# Patient Record
Sex: Male | Born: 1960 | Hispanic: Yes | Marital: Married | Smoking: Never smoker
Health system: Southern US, Community
[De-identification: ages and names within clinical notes are randomized; demographics above are authoritative.]

## PROBLEM LIST (undated history)

## (undated) DIAGNOSIS — IMO0002 Reserved for concepts with insufficient information to code with codable children: Secondary | ICD-10-CM

## (undated) HISTORY — PX: CHOLECYSTECTOMY: SHX55

## (undated) HISTORY — DX: Reserved for concepts with insufficient information to code with codable children: IMO0002

---

## 2013-05-30 ENCOUNTER — Other Ambulatory Visit: Payer: Self-pay

## 2013-05-30 MED ORDER — GABAPENTIN 300 MG PO CAPS: 300.0000 mg | ORAL_CAPSULE | Freq: Two times a day (BID) | ORAL | Status: AC

## 2013-06-07 ENCOUNTER — Ambulatory Visit: Payer: Managed Care, Other (non HMO)

## 2013-06-07 ENCOUNTER — Ambulatory Visit (INDEPENDENT_AMBULATORY_CARE_PROVIDER_SITE_OTHER): Payer: Managed Care, Other (non HMO) | Admitting: Family Medicine

## 2013-06-07 VITALS — BP 150/82 | HR 63 | Temp 98.1°F | Resp 16 | Ht 73.0 in | Wt 219.0 lb

## 2013-06-07 DIAGNOSIS — M545 Low back pain, unspecified: Secondary | ICD-10-CM

## 2013-06-07 DIAGNOSIS — R109 Unspecified abdominal pain: Secondary | ICD-10-CM

## 2013-06-07 LAB — COMPREHENSIVE METABOLIC PANEL
ALT: 18 U/L (ref 0–53)
AST: 18 U/L (ref 0–37)
Albumin: 4.5 g/dL (ref 3.5–5.2)
Alkaline Phosphatase: 64 U/L (ref 39–117)
Potassium: 3.9 mEq/L (ref 3.5–5.3)
Sodium: 140 mEq/L (ref 135–145)
Total Bilirubin: 0.6 mg/dL (ref 0.3–1.2)
Total Protein: 7.5 g/dL (ref 6.0–8.3)

## 2013-06-07 LAB — POCT URINALYSIS DIPSTICK
Bilirubin, UA: NEGATIVE
Glucose, UA: NEGATIVE
Leukocytes, UA: NEGATIVE
Protein, UA: NEGATIVE
Spec Grav, UA: >=1.03

## 2013-06-07 LAB — POCT CBC
Granulocyte percent: 64.6 %G (ref 37–80)
HCT, POC: 43.9 % (ref 43.5–53.7)
MCV: 97.3 fL — AB (ref 80–97)
MID (cbc): 0.6 (ref 0–0.9)
POC Granulocyte: 4.8 (ref 2–6.9)
Platelet Count, POC: 251 10*3/uL (ref 142–424)
RBC: 4.51 M/uL — AB (ref 4.69–6.13)
RDW, POC: 13.1 %

## 2013-06-07 LAB — LIPASE: Lipase: 52 U/L (ref 0–75)

## 2013-06-07 LAB — PSA: PSA: 0.76 ng/mL (ref <=4.00)

## 2013-06-07 LAB — POCT UA - MICROSCOPIC ONLY
Bacteria, U Microscopic: NEGATIVE
Crystals, Ur, HPF, POC: NEGATIVE

## 2013-06-07 MED ORDER — MELOXICAM 7.5 MG PO TABS: 7.5000 mg | ORAL_TABLET | Freq: Every day | ORAL | Status: AC

## 2013-06-07 MED ORDER — OMEPRAZOLE 20 MG PO CPDR: 20.0000 mg | DELAYED_RELEASE_CAPSULE | Freq: Every day | ORAL | Status: AC

## 2013-06-07 NOTE — Patient Instructions (Addendum)
Start the meloxicam once per day for low back pain - also take omeprazole once per day to help protect your stomach while taking this medicine.  If any dark/tarry stools, or blood in the stool, or any increase in abdominal pain - return to clinic or emergency room immediately. Start the other exercises as in the back care manual.  Recheck in 2 weeks - Return to the clinic or go to the nearest emergency room if any of your symptoms worsen or new symptoms occur.  Can call [phone] to schedule a physical.

## 2013-06-07 NOTE — Progress Notes (Signed)
Subjective:    Patient ID: Glenn Walker, male    DOB: 1961/12/09, 52 y.o.   MRN: [id number]  HPI Glenn Walker is a 52 y.o. male  New patient to me. Prior living in [place], moved a year ago. last seen by PCP in [place] - infrequent visits.  Last physical a little over a year ago.  Will schedule physical so we can obtain prior records.   Abd pain- lower - both sides, pain for 3-4 years.  Has had evaluated IN [place]. Had colonoscopy last year in [place] - few polyps - colonoscopy every 2-3 years d/t polyps. No FH colon cancer.  Endoscopy - 2 years ago - minor reflux and gastritis- prior on meds, but stopped after 6 months. Diagnosed with duodenal ulcer in 1993. Off meds now for past year. Same pain for years. No recent change in pain.  Hx of cholecystectomy in 1998. No blood in stool, no dark tarry stools.  Loose stools since cholecystectomy. BM about 2x/day.  No recent bowel changes. No blood in urine, no dysuria, nocturia  - 1-2 times per night. No new urinary symptoms. Prostate test ok beginning of last year. No fh of intestinal CA, bladder or prostate CA. No N/V.   LBP - past 5 years. seen by [facility] about 6 months ago - [name]. Unknown diagnosis.  No surgery, NKI, no MVA or specific time of onset. Notices of sitting for long time. Numb on outside of R upper thigh at times. Rx: Neurontin 300mg  QD - no relief. Also instructed to take B12, no relief.  No bowel or bladder incontinence, no saddle anesthesia, no lower extremity weakness. Other urgent care eval 8 months ago - slight relief with muscle relaxer. Rare - about once per month ibuprofen.   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   Review of Systems  Constitutional: Negative for fever, chills and unexpected weight change.  Gastrointestinal: Negative for nausea, vomiting, abdominal pain, blood in stool, abdominal distention and anal bleeding.  Genitourinary: Negative for dysuria and difficulty urinating.    Musculoskeletal: Positive for myalgias and back pain. Negative for joint swelling and gait problem.  Skin: Negative for color change and rash.  Neurological: Negative for weakness.       No le weakness. No bowel/bladder incontinence, no saddle anesthesia.  other as in HPI.      Objective:   Physical Exam  Vitals reviewed. Constitutional: He appears well-developed and well-nourished.  HENT:  Head: Normocephalic and atraumatic.  Neck: Normal range of motion.  Pulmonary/Chest: Effort normal.  Abdominal: Soft. Normal appearance and bowel sounds are normal. He exhibits no distension. There is no tenderness (nontender on exam - states feels soreness with twisting side to side. ). There is no rebound, no guarding and no CVA tenderness. No hernia. Hernia confirmed negative in the ventral area, confirmed negative in the right inguinal area and confirmed negative in the left inguinal area.  Musculoskeletal: He exhibits tenderness.       Lumbar back: He exhibits tenderness and bony tenderness. He exhibits normal range of motion (slight decreased flexion. ), no edema and no spasm.       Back:  Neurological: He is alert. He has normal strength. He displays normal reflexes. No sensory deficit. He displays no Babinski's sign on the right side.  Reflex Scores:      Patellar reflexes are 2+ on the right side and 2+ on the left side.      Achilles reflexes  are 2+ on the right side and 2+ on the left side. Able to heel and toe walk without difficulty. Negative SLR bliaterally  Skin: Skin is warm and dry.  Psychiatric: He has a normal mood and affect. His behavior is normal.  [facility] reading (PRIMARY) by  Dr. Neva Walker: LS spine - ? Spondylosis and decreased spaceT12 - L1.   Results for orders placed in visit on 06/07/13  POCT CBC      Result Value Range   WBC 7.4  4.6 - 10.2 K/uL   Lymph, poc 2.0  0.6 - 3.4   POC LYMPH PERCENT 27.1  10 - 50 %L   MID (cbc) 0.6  0 - 0.9   POC MID % 8.3  0 - 12 %M   POC  Granulocyte 4.8  2 - 6.9   Granulocyte percent 64.6  37 - 80 %G   RBC 4.51 (*) 4.69 - 6.13 M/uL   Hemoglobin 13.5 (*) 14.1 - 18.1 g/dL   HCT, POC 16.1  09.6 - 53.7 %   MCV 97.3 (*) 80 - 97 fL   MCH, POC 29.9  27 - 31.2 pg   MCHC 30.8 (*) 31.8 - 35.4 g/dL   RDW, POC 04.5     Platelet Count, POC 251  142 - 424 K/uL   MPV 8.7  0 - 99.8 fL  POCT UA - MICROSCOPIC ONLY      Result Value Range   WBC, Ur, HPF, POC 0-1     RBC, urine, microscopic 0-2     Bacteria, U Microscopic neg     Mucus, UA neg     Epithelial cells, urine per micros neg     Crystals, Ur, HPF, POC neg     Casts, Ur, LPF, POC neg     Yeast, UA neg    POCT URINALYSIS DIPSTICK      Result Value Range   Color, UA yellow     Clarity, UA clear     Glucose, UA neg     Bilirubin, UA neg     Ketones, UA trace     Spec Grav, UA >=1.030     Blood, UA neg     pH, UA 5.5     Protein, UA neg     Urobilinogen, UA 0.2     Nitrite, UA neg     Leukocytes, UA Negative         Assessment & Plan:  [name] is a 52 y.o. male  Abdominal  pain, other specified site - longstanding, stable by hx. Plan: POCT CBC, POCT UA - Microscopic Only, POCT urinalysis dipstick, PSA, HELICOBACTER PYLORI  ANTIBODY, IGM, Lipase, Comprehensive metabolic panel, omeprazole (PRILOSEC) 20 MG capsule.  Possible radiation of LBP. By report - remote hx of possible duodenal ulcer and colon polyps by colonoscopy by EKG last year, but prior endoscopy ok.  Trial of Mobic for back as below, but will also rx omeprazole for GI protection.  RTC/ER precautions given.  Back pain, lumbosacral - Plan: POCT CBC, POCT UA - Microscopic Only, POCT urinalysis dipstick, PSA, Comprehensive metabolic panel, DG Lumbar Spine Complete, meloxicam (MOBIC) 7.5 MG tablet.  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 Advised loose fitting beltline.  Trial  of mobic 7.5mg  QD, back care manual and recheck in 2 weeks. Ok to continue neurontin for now, but may need higher dose  if tolerated.   Borderline HGB, borderline BP - recheck at future ov. Rtc/er precautions above.   Plan on scheduling CPE.     Meds ordered this encounter  Medications  . meloxicam (MOBIC) 7.5 MG tablet    Sig: Take 1 tablet (7.5 mg total) by mouth daily.    Dispense:  30 tablet    Refill:  0  . omeprazole (PRILOSEC) 20 MG capsule    Sig: Take 1 capsule (20 mg total) by mouth daily.    Dispense:  30 capsule    Refill:  3     Patient Instructions  Start the meloxicam once per day for low back pain - also take omeprazole once per day to help protect your stomach while taking this medicine.  If any dark/tarry stools, or blood in the stool, or any increase in abdominal pain - return to clinic or emergency room immediately. Start the other exercises as in the back care manual.  Recheck in 2 weeks - Return to the clinic or go to the nearest emergency room if any of your symptoms worsen or new symptoms occur.  Can call [phone] to schedule a physical.

## 2013-06-09 LAB — HELICOBACTER PYLORI  ANTIBODY, IGM: Helicobacter pylori, IgM: 3.1 U/mL (ref <9.0)

## 2013-06-21 ENCOUNTER — Encounter (INDEPENDENT_AMBULATORY_CARE_PROVIDER_SITE_OTHER): Payer: Managed Care, Other (non HMO) | Admitting: Physician Assistant

## 2013-07-29 NOTE — Progress Notes (Signed)
This encounter was created in error - please disregard.

## 2014-01-05 IMAGING — CR DG LUMBAR SPINE COMPLETE 4+V
5 series · 5 of 5 positions shown · non-contrast
Comparison: None.

CLINICAL DATA: Low back pain.  No known injury.

LUMBAR SPINE - COMPLETE 4+ VIEW

[AP]
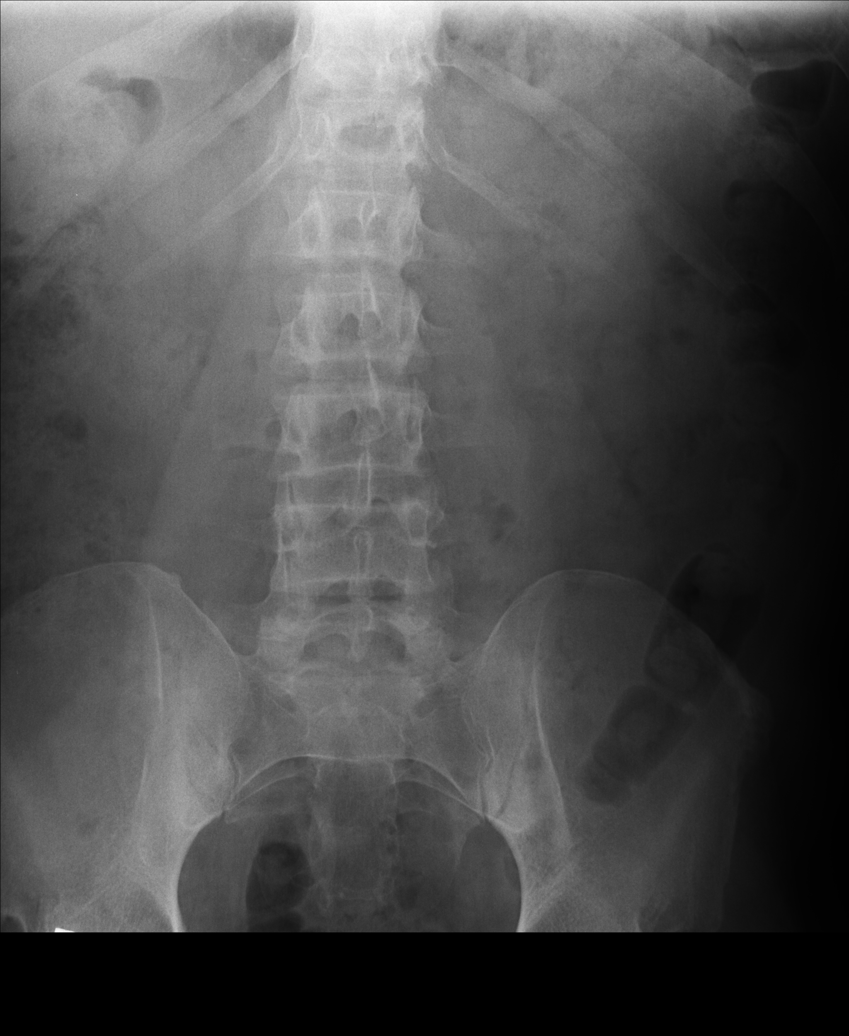

[rpo]
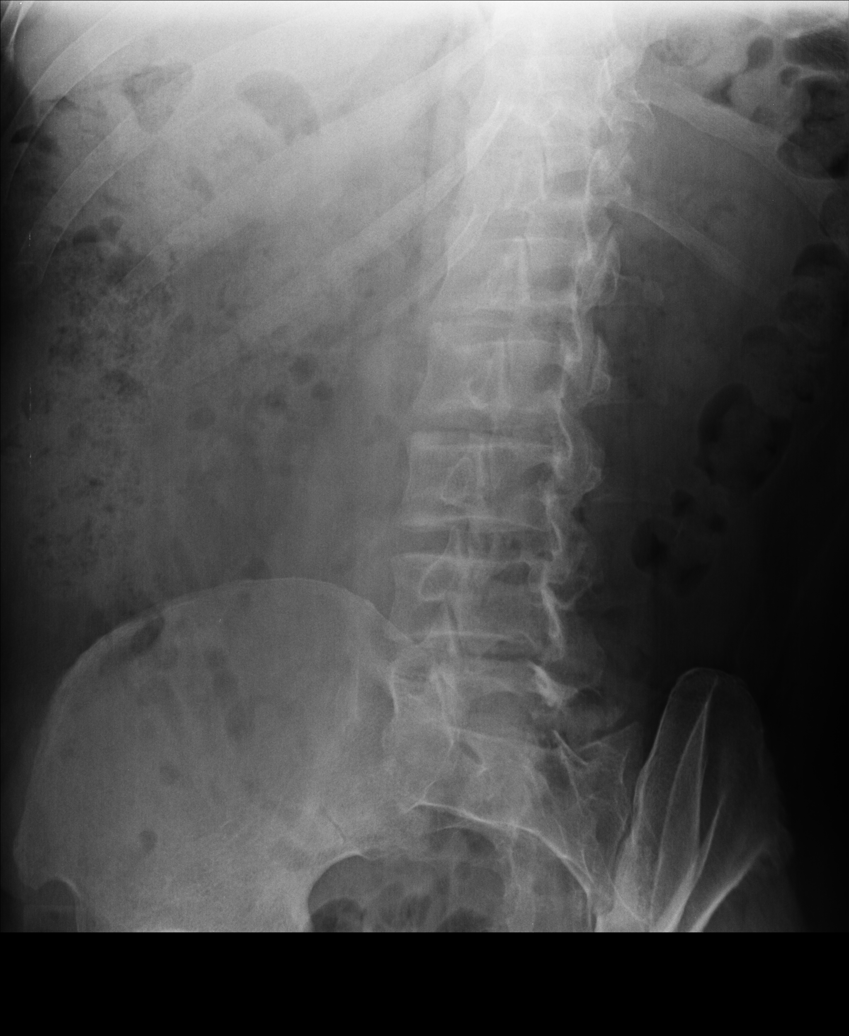

[lpo]
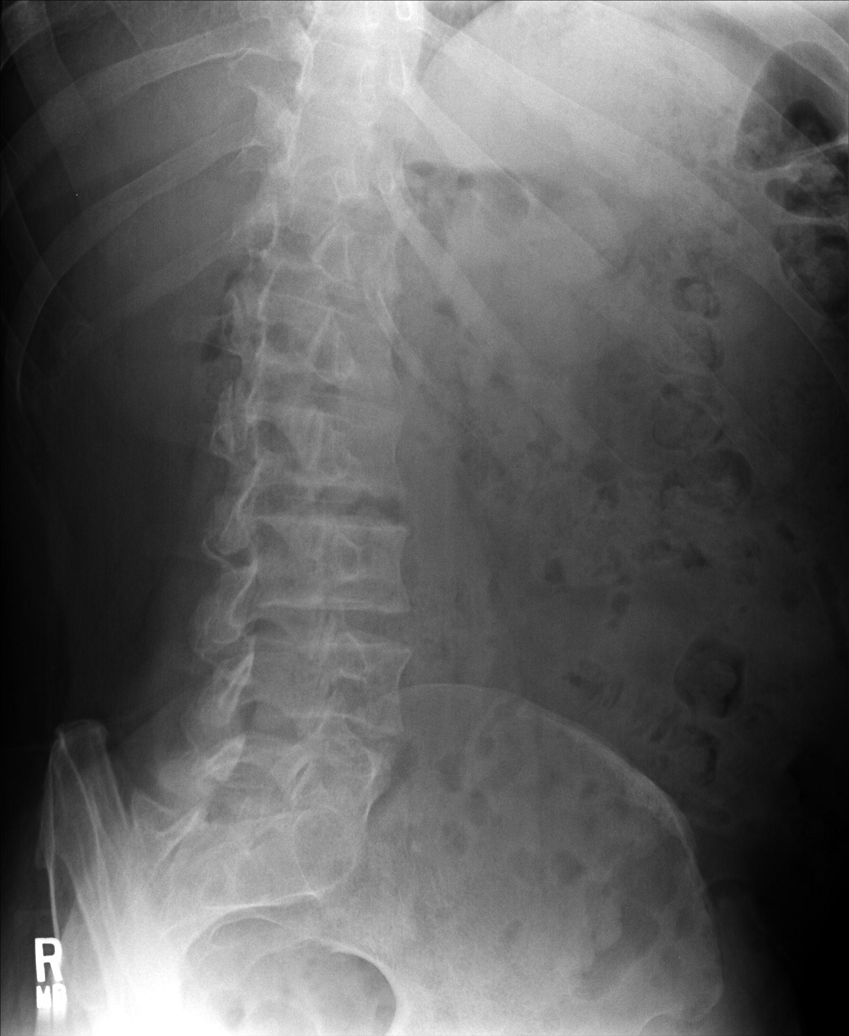

[lateral]
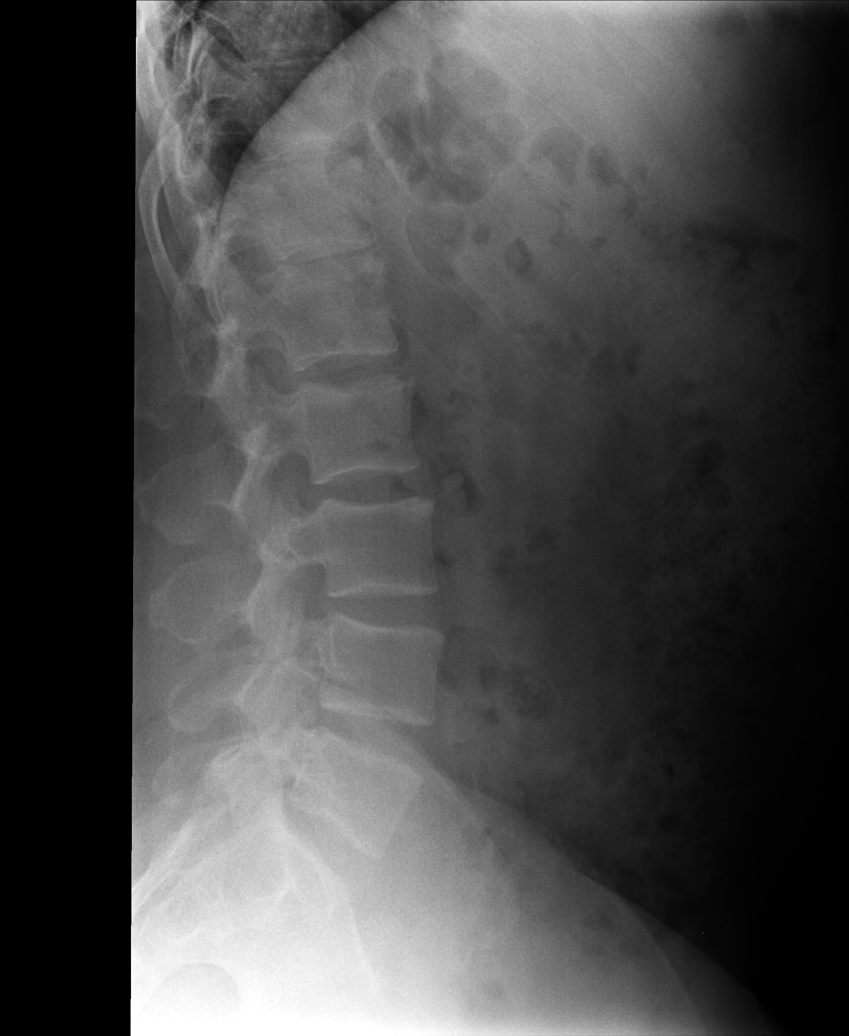

[l5 s1]
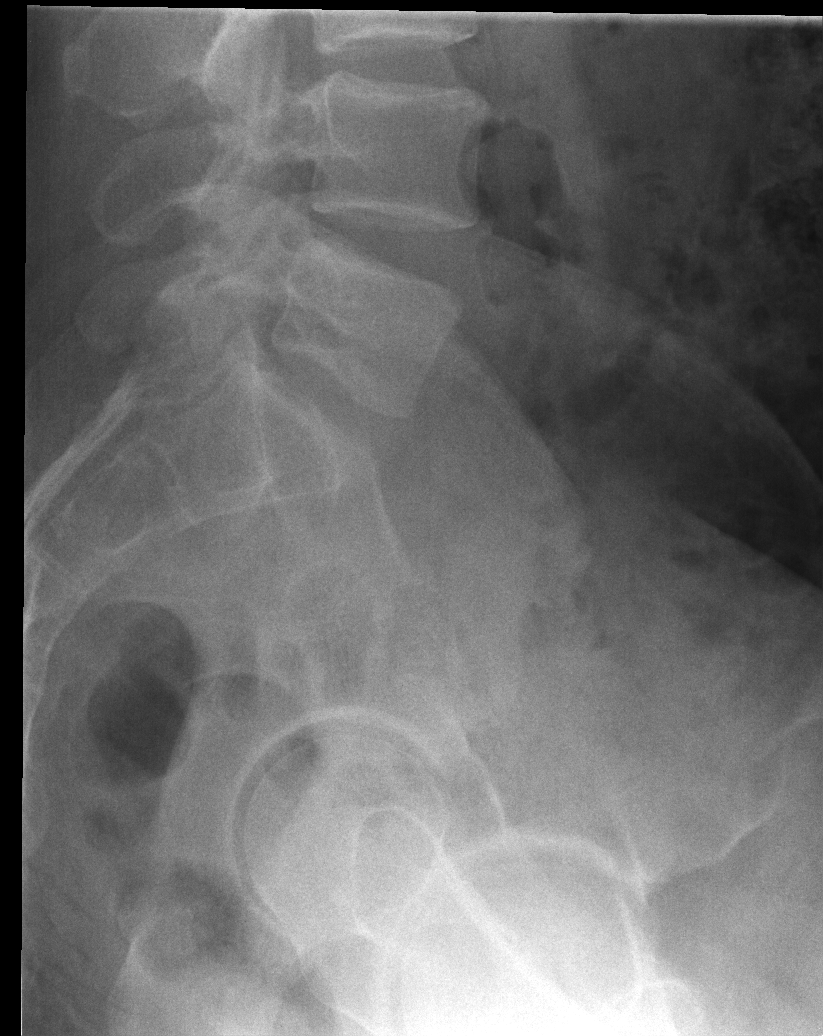

[5 of 5 positions shown; findings below may reference images not displayed]

FINDINGS: There is no evidence of lumbar spine fracture.
Alignment is normal.  Intervertebral disc spaces are maintained.
IMPRESSION: Negative lumbar spine radiographs.

## 2014-03-16 ENCOUNTER — Ambulatory Visit (INDEPENDENT_AMBULATORY_CARE_PROVIDER_SITE_OTHER): Payer: Managed Care, Other (non HMO) | Admitting: Emergency Medicine

## 2014-03-16 ENCOUNTER — Ambulatory Visit (HOSPITAL_COMMUNITY)
Admission: RE | Admit: 2014-03-16 | Discharge: 2014-03-16 | Disposition: A | Discharge status: Home / Self Care | Payer: Managed Care, Other (non HMO) | Source: Ambulatory Visit | Attending: Emergency Medicine | Admitting: Emergency Medicine

## 2014-03-16 VITALS — BP 130/78 | HR 71 | Temp 98.1°F | Resp 18 | Ht 73.0 in | Wt 211.0 lb

## 2014-03-16 DIAGNOSIS — G8320 Monoplegia of upper limb affecting unspecified side: Secondary | ICD-10-CM

## 2014-03-16 DIAGNOSIS — R42 Dizziness and giddiness: Secondary | ICD-10-CM | POA: Insufficient documentation

## 2014-03-16 LAB — POCT CBC
GRANULOCYTE PERCENT: 65.3 % (ref 37–80)
HCT, POC: 43.8 % (ref 43.5–53.7)
Hemoglobin: 13.6 g/dL — AB (ref 14.1–18.1)
Lymph, poc: 2 (ref 0.6–3.4)
MCH, POC: 30 pg (ref 27–31.2)
MCHC: 31.1 g/dL — AB (ref 31.8–35.4)
MCV: 96.5 fL (ref 80–97)
MID (CBC): 0.5 (ref 0–0.9)
MPV: 8.9 fL (ref 0–99.8)
PLATELET COUNT, POC: 293 10*3/uL (ref 142–424)
POC GRANULOCYTE: 4.8 (ref 2–6.9)
POC LYMPH PERCENT: 27.3 %L (ref 10–50)
POC MID %: 7.4 %M (ref 0–12)
RBC: 4.54 M/uL — AB (ref 4.69–6.13)
RDW, POC: 14.1 %
WBC: 7.3 10*3/uL (ref 4.6–10.2)

## 2014-03-16 LAB — COMPREHENSIVE METABOLIC PANEL
ALBUMIN: 4.3 g/dL (ref 3.5–5.2)
ALT: 29 U/L (ref 0–53)
AST: 22 U/L (ref 0–37)
Alkaline Phosphatase: 60 U/L (ref 39–117)
BUN: 12 mg/dL (ref 6–23)
CALCIUM: 9.5 mg/dL (ref 8.4–10.5)
CHLORIDE: 103 meq/L (ref 96–112)
CO2: 29 mEq/L (ref 19–32)
Creat: 1.17 mg/dL (ref 0.50–1.35)
Glucose, Bld: 106 mg/dL — ABNORMAL HIGH (ref 70–99)
POTASSIUM: 4.2 meq/L (ref 3.5–5.3)
SODIUM: 141 meq/L (ref 135–145)
TOTAL PROTEIN: 7.1 g/dL (ref 6.0–8.3)
Total Bilirubin: 0.7 mg/dL (ref 0.2–1.2)

## 2014-03-16 MED ORDER — MECLIZINE HCL 50 MG PO TABS: 50.0000 mg | ORAL_TABLET | Freq: Three times a day (TID) | ORAL | Status: AC | PRN

## 2014-03-16 NOTE — Patient Instructions (Signed)
[facility] at [facility] For a CT of you head. You are to go straight to Radiology to register. Do not go to the ER. Do not see an ER doctor. You are only there for a CT. Marland Kitchen.  Take a Right out of your parking lot. Turn left at [address]. Right onto [address] (the second exit). Turn left on [address]

## 2014-03-16 NOTE — Addendum Note (Signed)
Addended by: Carmelina DaneANDERSON, Eligio Angert S on: 03/16/2014 08:18 PM   Modules accepted: Orders

## 2014-03-16 NOTE — Progress Notes (Signed)
[facility] [address], [place] [id number] [phone]  Date:  03/16/2014   Name:  Glenn Walker   DOB:  1960/12/31   MRN:  [id number]  PCP:  No primary provider on file.    Chief Complaint: Dizziness, weakness in left arm, Colonoscopy and Fever blister   History of Present Illness:  Glenn Walker is a 53 y.o. very pleasant male patient who presents with the following:  Patient wants to discuss colonoscopy. Had scope and polypectomy 3 years ago and told to have a repeat in 5 years.  He was reassured that he does not require repeat for 2 years.   Has dizziness since Monday that has worsened. Was intermittent today is constant.  Associated now with a "heaviness in the left arm.  Denies chest pain, tightness, heaviness, or pressure.  No shortness of breath, cough or wheezing, no nausea or vomiting. Has intermittent sensation of rapid heart beat.    There are no active problems to display for this patient.   Past Medical History  Diagnosis Date  . Ulcer     Past Surgical History  Procedure Laterality Date  . Cholecystectomy      History  Substance Use Topics  . Smoking status: Never Smoker   . Smokeless tobacco: Not on file  . Alcohol Use: No    Family History  Problem Relation Age of Onset  . Alcohol abuse Mother     No Known Allergies  Medication list has been reviewed and updated.  Current Outpatient Prescriptions on File Prior to Visit  Medication Sig Dispense Refill  . gabapentin (NEURONTIN) 300 MG capsule Take 1 capsule (300 mg total) by mouth 2 (two) times daily.  60 capsule  6  . meloxicam (MOBIC) 7.5 MG tablet Take 1 tablet (7.5 mg total) by mouth daily.  30 tablet  0  . omeprazole (PRILOSEC) 20 MG capsule Take 1 capsule (20 mg total) by mouth daily.  30 capsule  3   No current facility-administered medications on file prior to visit.    Review of Systems:  As per HPI, otherwise negative.    Physical Examination: Filed  Vitals:   03/16/14 1512  BP: 130/78  Pulse: 71  Temp: 98.1 F (36.7 C)  Resp: 18   Filed Vitals:   03/16/14 1512  Height: 6\' 1"  (1.854 m)  Weight: 211 lb (95.709 kg)   Body mass index is 27.84 kg/(m^2). Ideal Body Weight: Weight in (lb) to have BMI = 25: 189.1  GEN: WDWN, NAD, Non-toxic, A & O x 3 HEENT: Atraumatic, Normocephalic. Neck supple. No masses, No LAD. Ears and Nose: No external deformity. CV: RRR, No M/G/R. No JVD. No thrill. No extra heart sounds. PULM: CTA B, no wheezes, crackles, rhonchi. No retractions. No resp. distress. No accessory muscle use. ABD: S, NT, ND, +BS. No rebound. No HSM. EXTR: No c/c/e NEURO Normal gait,  Balance and coordination.  PRRERLA EOMI CN 2-12 intact.   PSYCH: Normally interactive. Conversant. Not depressed or anxious appearing.  Calm demeanor.    Assessment and Plan: Vertigo Labs CT  Signed,  Phillips OdorJeffery Cova Knieriem, MD   Results for orders placed in visit on 03/16/14  COMPREHENSIVE METABOLIC PANEL      Result Value Ref Range   Sodium 141  135 - 145 mEq/L   Potassium 4.2  3.5 - 5.3 mEq/L   Chloride 103  96 - 112 mEq/L   CO2 29  19 - 32 mEq/L  Glucose, Bld 106 (*) 70 - 99 mg/dL   BUN 12  6 - 23 mg/dL   Creat 1.61  0.96 - 0.45 mg/dL   Total Bilirubin 0.7  0.2 - 1.2 mg/dL   Alkaline Phosphatase 60  39 - 117 U/L   AST 22  0 - 37 U/L   ALT 29  0 - 53 U/L   Total Protein 7.1  6.0 - 8.3 g/dL   Albumin 4.3  3.5 - 5.2 g/dL   Calcium 9.5  8.4 - 40.9 mg/dL  POCT CBC      Result Value Ref Range   WBC 7.3  4.6 - 10.2 K/uL   Lymph, poc 2.0  0.6 - 3.4   POC LYMPH PERCENT 27.3  10 - 50 %L   MID (cbc) 0.5  0 - 0.9   POC MID % 7.4  0 - 12 %M   POC Granulocyte 4.8  2 - 6.9   Granulocyte percent 65.3  37 - 80 %G   RBC 4.54 (*) 4.69 - 6.13 M/uL   Hemoglobin 13.6 (*) 14.1 - 18.1 g/dL   HCT, POC 81.1  91.4 - 53.7 %   MCV 96.5  80 - 97 fL   MCH, POC 30.0  27 - 31.2 pg   MCHC 31.1 (*) 31.8 - 35.4 g/dL   RDW, POC 78.2     Platelet Count,  POC 293  142 - 424 K/uL   MPV 8.9  0 - 99.8 fL

## 2014-03-17 ENCOUNTER — Telehealth: Payer: Self-pay

## 2014-03-17 NOTE — Telephone Encounter (Signed)
Patient needs proof ( a work excuse note) that he was sick on Monday to be excused for work and that's why he was seen yesterday by Dr. Dareen PianoAnderson. Patient also wanted to ask someone clinical about vertigo. Please advise.    [phone]

## 2014-03-17 NOTE — Telephone Encounter (Signed)
Wrote a note for patient to be out today; return Monday.  Patient still feels dizzy, patient started taking medication last night. informed patient of negative CT scan so nothing concerning there, instructed patient to give the medication a little time to work, if still feels dizzy after a day or so OR dizziness worsens call or RTC.

## 2014-10-14 IMAGING — CT CT HEAD W/O CM
1 of 2 series · 16 of 30 positions shown, 20 images · non-contrast
Comparison: None

CLINICAL DATA: Dizziness and weakness.

EXAM:
CT HEAD WITHOUT CONTRAST
TECHNIQUE: Contiguous axial images were obtained from the base of the skull
through the vertex without contrast.

[Series 3: head 2.0 h70h · axial · 0.44mm/px · z∈[-94,+38]mm · 16 of 74 slices shown, 20 images]
[im 4/74  brain]
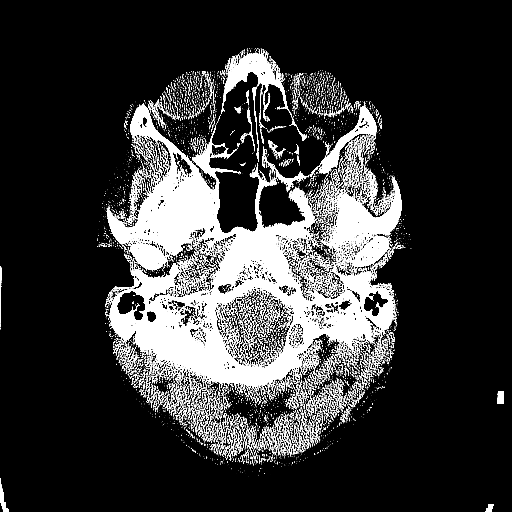
[im 4/74  bone]
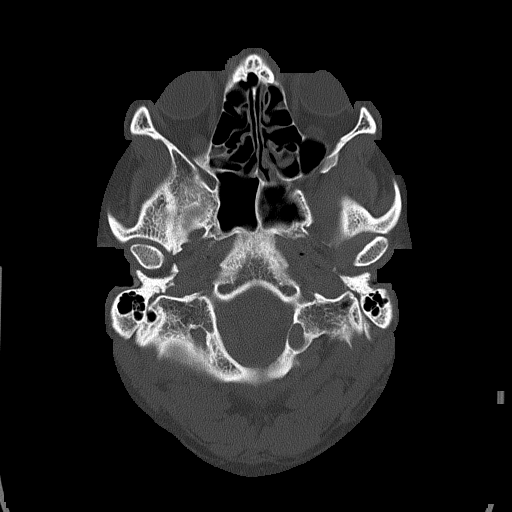
[im 8/74  brain]
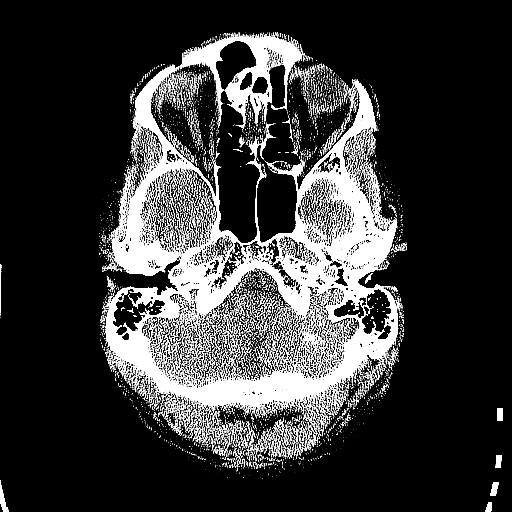
[im 11/74  brain]
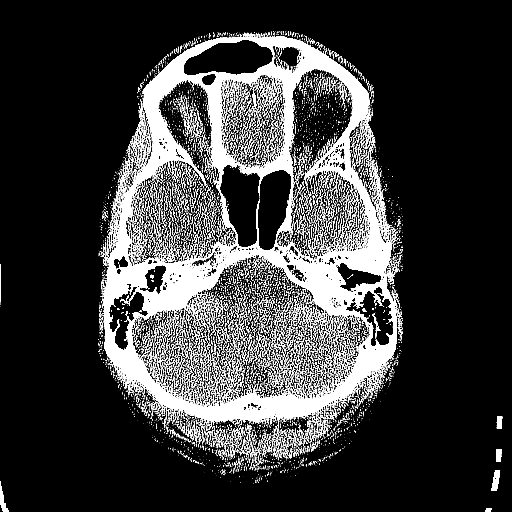
[im 19/74  brain]
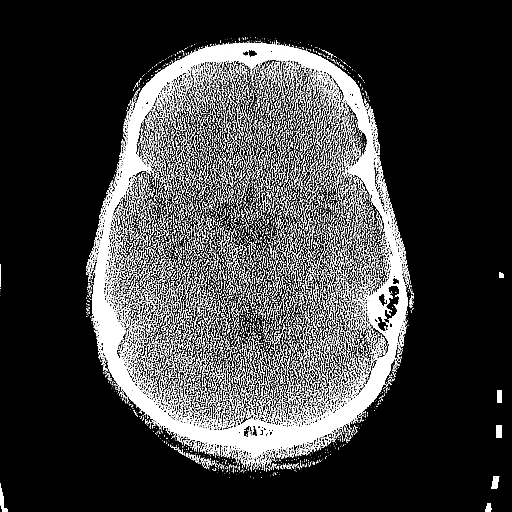
[im 22/74  brain]
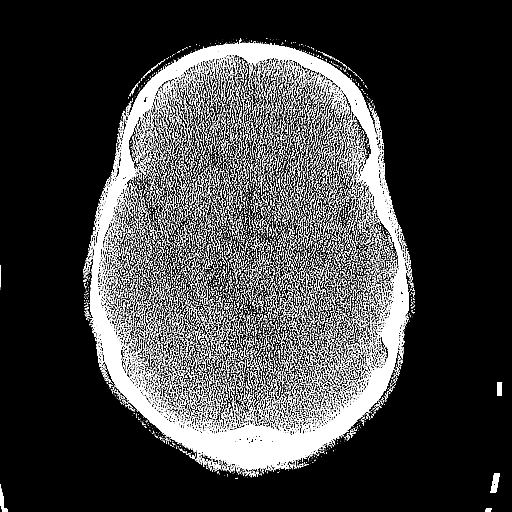
[im 22/74  bone]
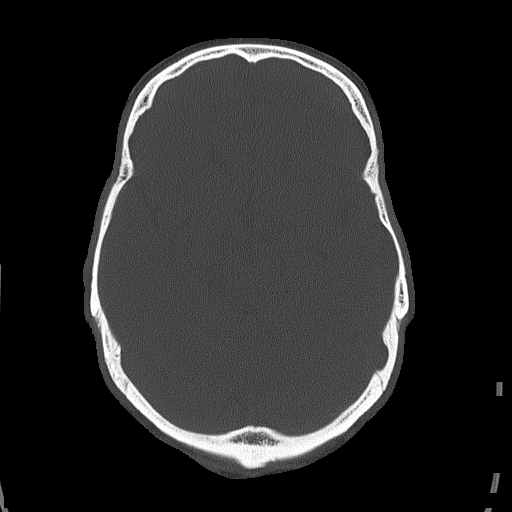
[im 26/74  brain]
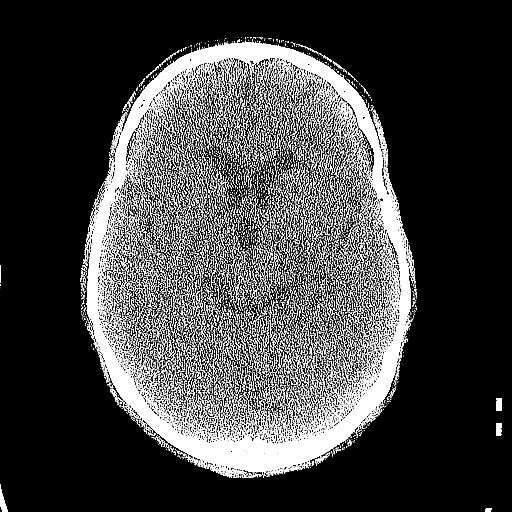
[im 30/74  brain]
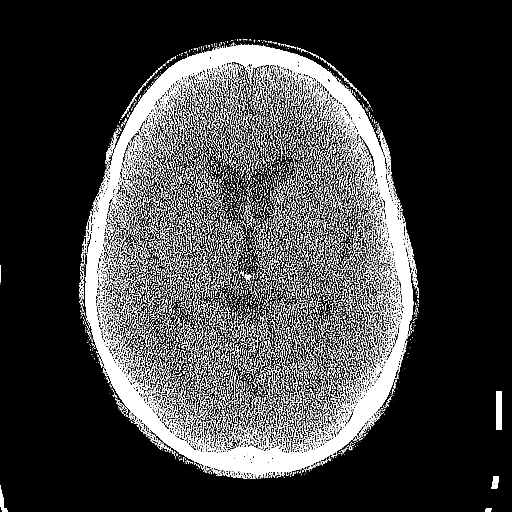
[im 33/74  brain]
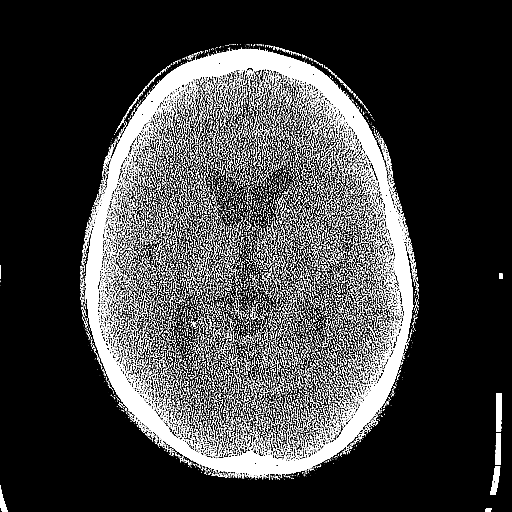
[im 41/74  brain]
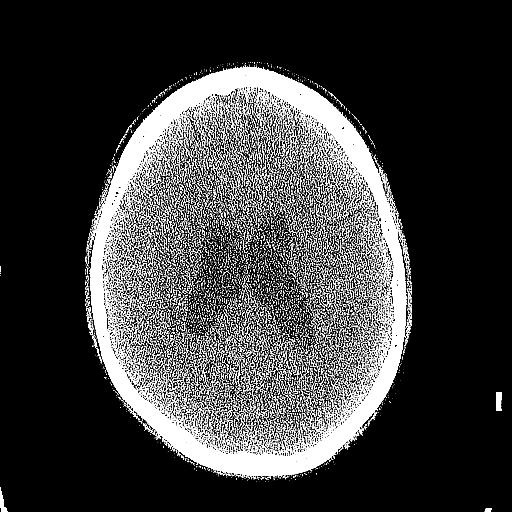
[im 41/74  bone]
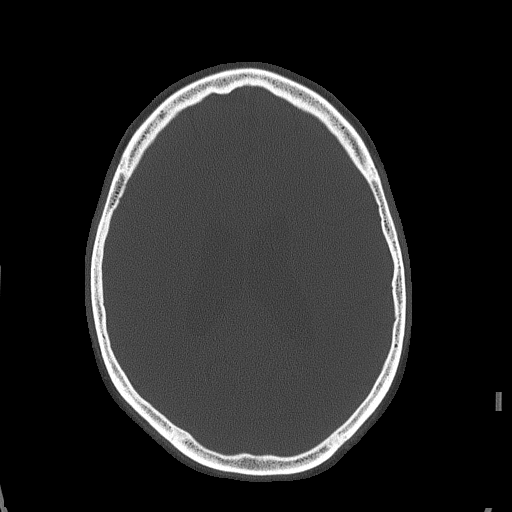
[im 44/74  brain]
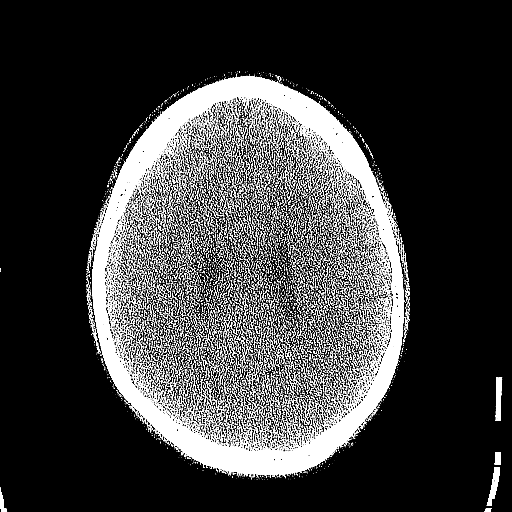
[im 48/74  brain]
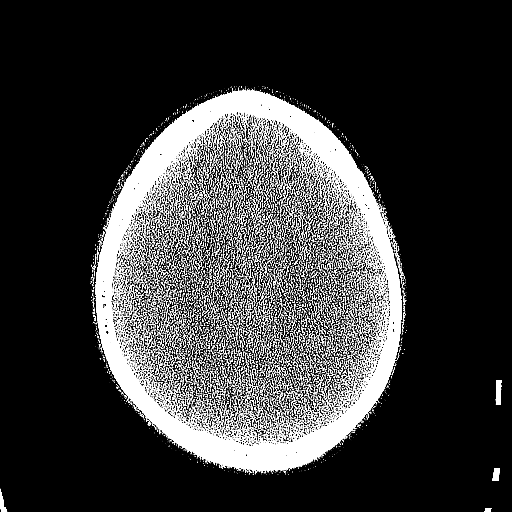
[im 52/74  brain]
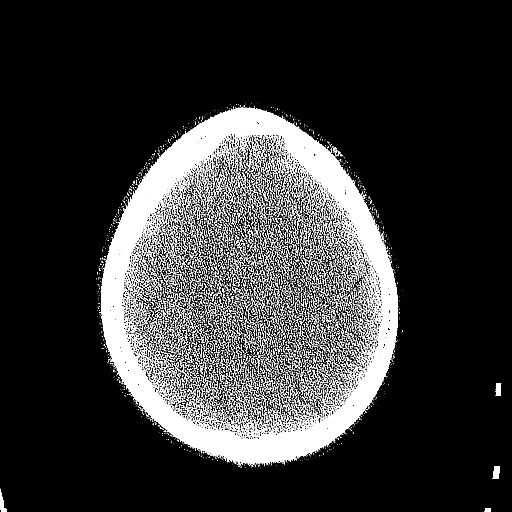
[im 55/74  brain]
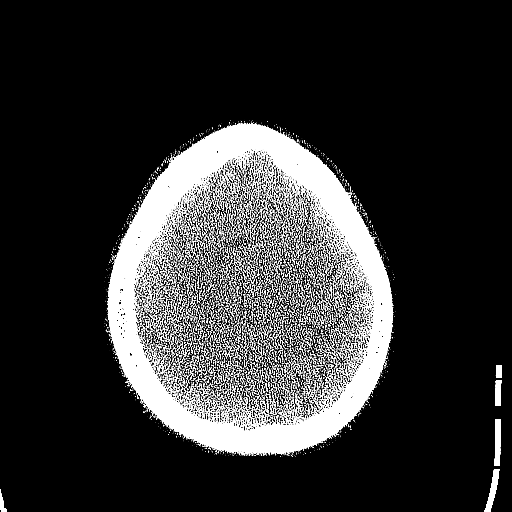
[im 55/74  bone]
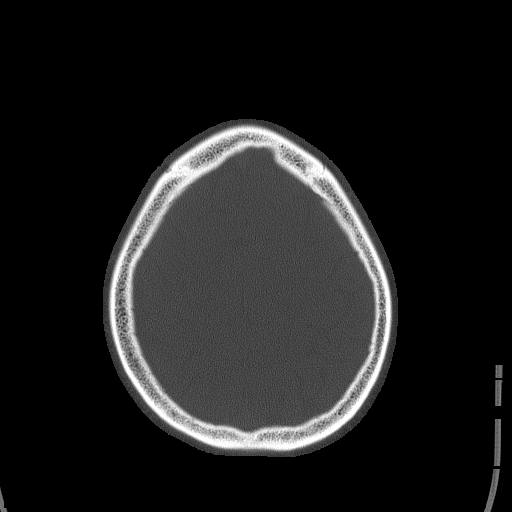
[im 63/74  brain]
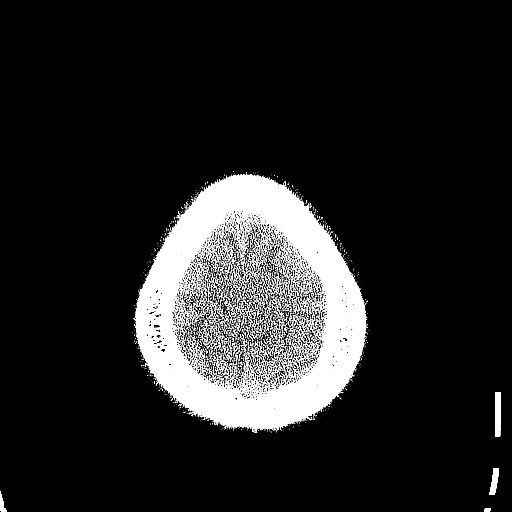
[im 66/74  brain]
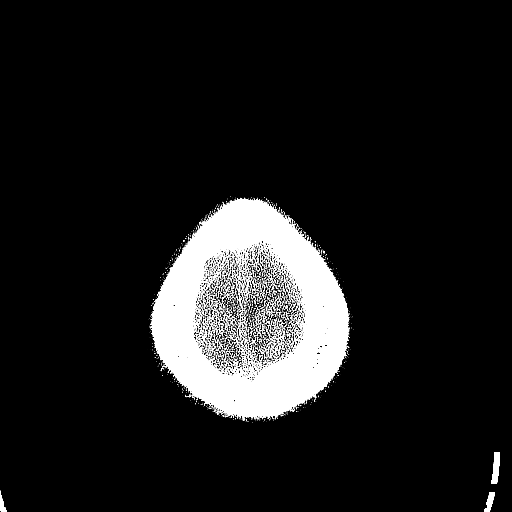
[im 70/74  brain]
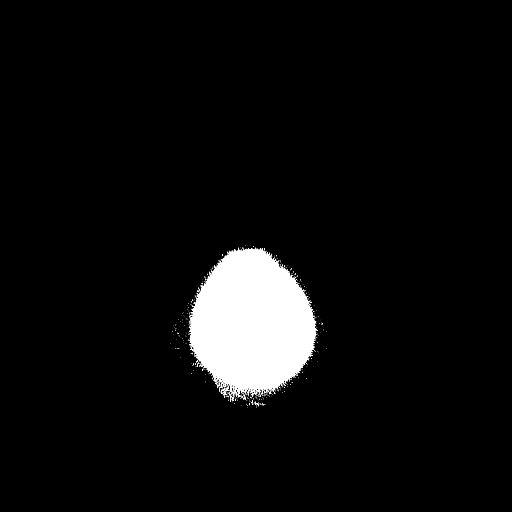

[16 of 30 positions shown; findings below may reference images not displayed]

FINDINGS: Normal appearance of the intracranial structures. No evidence for
acute hemorrhage, mass lesion, midline shift, hydrocephalus or large
infarct. No acute bony abnormality. Mild mucosal thickening in the
ethmoid air cells. Mild mucosal thickening in the left frontal
sinus.
IMPRESSION: No acute intracranial abnormality.

## 2014-12-16 ENCOUNTER — Encounter (HOSPITAL_COMMUNITY): Payer: Self-pay | Admitting: Emergency Medicine

## 2014-12-16 ENCOUNTER — Emergency Department (HOSPITAL_COMMUNITY)
Admission: EM | Admit: 2014-12-16 | Discharge: 2014-12-16 | Disposition: A | Discharge status: Home / Self Care | Payer: Managed Care, Other (non HMO) | Attending: Emergency Medicine | Admitting: Emergency Medicine

## 2014-12-16 DIAGNOSIS — Z8711 Personal history of peptic ulcer disease: Secondary | ICD-10-CM | POA: Diagnosis not present

## 2014-12-16 DIAGNOSIS — R519 Headache, unspecified: Secondary | ICD-10-CM

## 2014-12-16 DIAGNOSIS — Z791 Long term (current) use of non-steroidal anti-inflammatories (NSAID): Secondary | ICD-10-CM | POA: Insufficient documentation

## 2014-12-16 DIAGNOSIS — Z79899 Other long term (current) drug therapy: Secondary | ICD-10-CM | POA: Diagnosis not present

## 2014-12-16 DIAGNOSIS — R110 Nausea: Secondary | ICD-10-CM | POA: Diagnosis not present

## 2014-12-16 DIAGNOSIS — R51 Headache: Secondary | ICD-10-CM | POA: Diagnosis not present

## 2014-12-16 DIAGNOSIS — R42 Dizziness and giddiness: Secondary | ICD-10-CM | POA: Diagnosis present

## 2014-12-16 LAB — CBC WITH DIFFERENTIAL/PLATELET
Basophils Absolute: 0 10*3/uL (ref 0.0–0.1)
Basophils Relative: 1 % (ref 0–1)
EOS PCT: 6 % — AB (ref 0–5)
Eosinophils Absolute: 0.5 10*3/uL (ref 0.0–0.7)
HEMATOCRIT: 41.2 % (ref 39.0–52.0)
Hemoglobin: 13.6 g/dL (ref 13.0–17.0)
LYMPHS PCT: 24 % (ref 12–46)
Lymphs Abs: 1.8 10*3/uL (ref 0.7–4.0)
MCH: 30.4 pg (ref 26.0–34.0)
MCHC: 33 g/dL (ref 30.0–36.0)
MCV: 92.2 fL (ref 78.0–100.0)
MONO ABS: 0.8 10*3/uL (ref 0.1–1.0)
Monocytes Relative: 10 % (ref 3–12)
Neutro Abs: 4.4 10*3/uL (ref 1.7–7.7)
Neutrophils Relative %: 59 % (ref 43–77)
PLATELETS: 222 10*3/uL (ref 150–400)
RBC: 4.47 MIL/uL (ref 4.22–5.81)
RDW: 12.3 % (ref 11.5–15.5)
WBC: 7.4 10*3/uL (ref 4.0–10.5)

## 2014-12-16 LAB — BASIC METABOLIC PANEL
ANION GAP: 9 (ref 5–15)
BUN: 15 mg/dL (ref 6–23)
CALCIUM: 9.3 mg/dL (ref 8.4–10.5)
CO2: 25 mEq/L (ref 19–32)
CREATININE: 1.05 mg/dL (ref 0.50–1.35)
Chloride: 103 mEq/L (ref 96–112)
GFR calc Af Amer: >90 mL/min (ref >90)
GFR calc non Af Amer: 80 mL/min — ABNORMAL LOW (ref >90)
Glucose, Bld: 124 mg/dL — ABNORMAL HIGH (ref 70–99)
Potassium: 4.1 mEq/L (ref 3.7–5.3)
Sodium: 137 mEq/L (ref 137–147)

## 2014-12-16 LAB — I-STAT CHEM 8, ED
BUN: 16 mg/dL (ref 6–23)
CALCIUM ION: 1.14 mmol/L (ref 1.12–1.23)
CHLORIDE: 106 meq/L (ref 96–112)
CREATININE: 1.1 mg/dL (ref 0.50–1.35)
GLUCOSE: 123 mg/dL — AB (ref 70–99)
HCT: 42 % (ref 39.0–52.0)
Hemoglobin: 14.3 g/dL (ref 13.0–17.0)
Potassium: 4 mEq/L (ref 3.7–5.3)
Sodium: 140 mEq/L (ref 137–147)
TCO2: 25 mmol/L (ref 0–100)

## 2014-12-16 MED ORDER — MECLIZINE HCL 50 MG PO TABS: 50.0000 mg | ORAL_TABLET | Freq: Three times a day (TID) | ORAL | Status: AC | PRN

## 2014-12-16 MED ORDER — ONDANSETRON HCL 4 MG/2ML IJ SOLN
4.0000 mg | Freq: Once | INTRAMUSCULAR | Status: AC
  Administered 2014-12-16: 4 mg via INTRAVENOUS
  Filled 2014-12-16: qty 2

## 2014-12-16 MED ORDER — MECLIZINE HCL 25 MG PO TABS
25.0000 mg | ORAL_TABLET | Freq: Once | ORAL | Status: AC
  Administered 2014-12-16: 25 mg via ORAL
  Filled 2014-12-16: qty 1

## 2014-12-16 NOTE — ED Notes (Signed)
PA at bedside.

## 2014-12-16 NOTE — ED Notes (Signed)
Pt reporting that he is feeling better and wants to know how much longer he will be here. Mercedes PA brought to bedside.

## 2014-12-16 NOTE — Discharge Instructions (Signed)
Use meclizine as directed as needed for dizziness/nausea. Use the list below to find a regular doctor, or see [facility] to follow up for ongoing medical care. Return to the ER for changes or worsening symptoms   Benign Positional Vertigo Vertigo means you feel like you or your surroundings are moving when they are not. Benign positional vertigo is the most common form of vertigo. Benign means that the cause of your condition is not serious. Benign positional vertigo is more common in older adults. CAUSES  Benign positional vertigo is the result of an upset in the labyrinth system. This is an area in the middle ear that helps control your balance. This may be caused by a viral infection, head injury, or repetitive motion. However, often no specific cause is found. SYMPTOMS  Symptoms of benign positional vertigo occur when you move your head or eyes in different directions. Some of the symptoms may include:  Loss of balance and falls.  Vomiting.  Blurred vision.  Dizziness.  Nausea.  Involuntary eye movements (nystagmus). DIAGNOSIS  Benign positional vertigo is usually diagnosed by physical exam. If the specific cause of your benign positional vertigo is unknown, your caregiver may perform imaging tests, such as magnetic resonance imaging (MRI) or computed tomography (CT). TREATMENT  Your caregiver may recommend movements or procedures to correct the benign positional vertigo. Medicines such as meclizine, benzodiazepines, and medicines for nausea may be used to treat your symptoms. In rare cases, if your symptoms are caused by certain conditions that affect the inner ear, you may need surgery. HOME CARE INSTRUCTIONS   Follow your caregiver's instructions.  Move slowly. Do not make sudden body or head movements.  Avoid driving.  Avoid operating heavy machinery.  Avoid performing any tasks that would be dangerous to you or others during a vertigo episode.  Drink enough  fluids to keep your urine clear or pale yellow. SEEK IMMEDIATE MEDICAL CARE IF:   You develop problems with walking, weakness, numbness, or using your arms, hands, or legs.  You have difficulty speaking.  You develop severe headaches.  Your nausea or vomiting continues or gets worse.  You develop visual changes.  Your family or friends notice any behavioral changes.  Your condition gets worse.  You have a fever.  You develop a stiff neck or sensitivity to light. MAKE SURE YOU:   Understand these instructions.  Will watch your condition.  Will get help right away if you are not doing well or get worse. Document Released: 09/22/2006 Document Revised: 03/08/2012 Document Reviewed: 09/04/2011 [facility] Patient Information 2015 [employer] [place]. This information is not intended to replace advice given to you by your health care provider. Make sure you discuss any questions you have with your health care provider. Emergency Department Resource Guide 1) Find a Doctor and Pay Out of Pocket Although you won't have to find out who is covered by your insurance plan, it is a good idea to ask around and get recommendations. You will then need to call the office and see if the doctor you have chosen will accept you as a new patient and what types of options they offer for patients who are self-pay. Some doctors offer discounts or will set up payment plans for their patients who do not have insurance, but you will need to ask so you aren't surprised when you get to your appointment.  2) Contact Your Local Health Department Not all health departments have doctors that can see patients for sick  visits, but many do, so it is worth a call to see if yours does. If you don't know where your local health department is, you can check in your phone book. The CDC also has a tool to help you locate your state's health department, and many state websites also have listings of all of their local health  departments.  3) Find a Walk-in Clinic If your illness is not likely to be very severe or complicated, you may want to try a walk in clinic. These are popping up all over the country in pharmacies, drugstores, and shopping centers. They're usually staffed by nurse practitioners or physician assistants that have been trained to treat common illnesses and complaints. They're usually fairly quick and inexpensive. However, if you have serious medical issues or chronic medical problems, these are probably not your best option.  No Primary Care Doctor: - Call Health Connect at  [phone] - they can help you locate a primary care doctor that  accepts your insurance, provides certain services, etc. - Physician Referral Service- [phone]  Chronic Pain Problems: Organization         Address  Phone   Notes  [facility]  [phone] Patients need to be referred by their primary care doctor.   Medication Assistance: Organization         Address  Phone   Notes  [facility] Medication [facility] [address] [place], [place] [address] [phone] --Must be a resident of [employer] -- Must have NO insurance coverage whatsoever (no Medicaid/ Medicare, etc.) -- The pt. MUST have a primary care doctor that directs their care regularly and follows them in the community   [facility]  [phone]   [employer]  [phone]    Agencies that provide inexpensive medical care: Organization         Address  Phone   Notes  [facility]  [phone]   [facility]    [phone]   [facility] [address] [place], [place] [address] [phone]   [facility] [address] [place], [place] [phone]   [facility]    [phone]   [facility]    [phone]   [facility]  [address], [place] Phone:  [phone], Fax:  [phone] Hours of Operation:  9 am - 6 pm, M-F.  Also accepts Medicaid/Medicare and self-pay.  [facility]  [address], [place] Phone: [phone], Fax: [phone]. Hours of Operation:  8:30 am - 5:30 pm, M-F.  Also accepts Medicaid and self-pay.  [facility] [place] [address], [place] Phone: [phone]   [facility] [address] [place], [place] [phone], Ext. [phone] Mondays & Thursdays: 7-9 AM.  First 15 patients are seen on a first come, first serve basis.    Medicaid-accepting [facility] Providers:  Organization         Address  Phone   Notes  [facility] [address], [place] [phone] Also accepts self-pay patients.  [facility] [address] [place], [place]  [phone]   [facility] [address], [place] [phone]   [facility] [address] [place] [phone]  Renaye RakersVeita Bland [address], [place]   [phone]) [phone] Only accepts [place] Access Medicaid patients after they have their name applied to their card.   Self-Pay (no insurance) in [facility] [place]:  Organization         Address  Phone   Notes  Sickle Cell Patients, [facility] [address], [place] [phone]   [facility] [address], [place] [phone]) [phone]   [facility]  [address], [place] [phone]   [facility]/Dr. Osei-Bonsu  [address] [place] [address], [place] or [phone], [place] [phone] Phone number for both [place] and [place] locations is the same.  [facility] [address], [place] [phone]   [facility] [place] [address] [place] [address], [place] or [address] [phone]   [facility] [address], [place] [phone], phone; [phone], fax Sees patients 1st and 3rd Saturday of every month.  Must not qualify for public or private insurance (i.e. Medicaid, Medicare, [place] Health Choice, Veterans' Benefits)  Household income should be no more than 200% of the poverty level The clinic cannot treat you if you are pregnant or think you are pregnant  Sexually transmitted diseases are not treated at the clinic.

## 2014-12-16 NOTE — ED Notes (Signed)
Pt reports he has a slight headache to the right side of his head. Aching in character. Lights dimmed for comfort. Patient reports that his nausea is improving as well as dizziness.

## 2014-12-16 NOTE — ED Provider Notes (Signed)
CSN: [id number]     Arrival date & time 12/16/14  [id number] History   First MD Initiated Contact with Patient 12/16/14 0615     Chief Complaint  Patient presents with  . Dizziness     (Consider location/radiation/quality/duration/timing/severity/associated sxs/prior Treatment) HPI Comments: Glenn Walker is a 53 y.o. male with a PMHx of PUD and vertigo, who presents to the ED with complaints of dizziness that started at 3:30am when he turned in bed. Initially it was severe but since its onset it has improved. States it feels like a spinning sensation. He has a hx of vertigo, diagnosed in March by Dr. Dareen PianoAnderson, and was given meclizine then with good results but did not try taking this today. Head movement exacerbates his symptoms, and he has no known alleviating factors. Additional symptoms include slight headache (states he gets headaches when he doesn't sleep, this one is 2/10 R parietal nonradiating intermittent throbbing, with no known aggravating factors, and relieved by rest), and mild nausea which has occurred previously with his vertigo. Denies fevers, chills, tinnitus, hearing loss, vision changes, URI symptoms, CP, palpitations, SOB, abd pain, vomiting, diarrhea, constipation, hematochezia, melena, hematuria, dysuria, myalgias, arthralgias, weakness, paresthesias, numbness, syncope, or rashes.   Patient is a 53 y.o. male presenting with dizziness. The history is provided by the patient. No language interpreter was used.  Dizziness Quality:  Vertigo Severity:  Moderate Onset quality:  Sudden Duration:  3 hours Timing:  Constant Progression:  Improving Chronicity:  Recurrent Context: head movement   Relieved by:  None tried Worsened by:  Turning head Ineffective treatments:  None tried Associated symptoms: headaches and nausea   Associated symptoms: no blood in stool, no chest pain, no diarrhea, no hearing loss, no palpitations, no shortness of breath, no syncope, no tinnitus, no vision  changes, no vomiting and no weakness   Risk factors: hx of vertigo     Past Medical History  Diagnosis Date  . Ulcer    Past Surgical History  Procedure Laterality Date  . Cholecystectomy     Family History  Problem Relation Age of Onset  . Alcohol abuse Mother    History  Substance Use Topics  . Smoking status: Never Smoker   . Smokeless tobacco: Not on file  . Alcohol Use: No    Review of Systems  Constitutional: Negative for fever and chills.  HENT: Negative for congestion, hearing loss, sinus pressure and tinnitus.   Eyes: Negative for photophobia, pain and visual disturbance.  Respiratory: Negative for shortness of breath.   Cardiovascular: Negative for chest pain, palpitations and syncope.  Gastrointestinal: Positive for nausea. Negative for vomiting, abdominal pain, diarrhea, constipation and blood in stool.  Genitourinary: Negative for dysuria and hematuria.  Musculoskeletal: Negative for myalgias, back pain, arthralgias and neck pain.  Skin: Negative for rash.  Neurological: Positive for dizziness and headaches. Negative for syncope, weakness, light-headedness and numbness.  Psychiatric/Behavioral: Negative for confusion.   10 Systems reviewed and are negative for acute change except as noted in the HPI.    Allergies  Review of patient's allergies indicates no known allergies.  Home Medications   Prior to Admission medications   Medication Sig Start Date End Date Taking? Authorizing Provider  gabapentin (NEURONTIN) 300 MG capsule Take 1 capsule (300 mg total) by mouth 2 (two) times daily. 05/30/13   York Spanielharles K Willis, MD  meclizine (ANTIVERT) 50 MG tablet Take 1 tablet (50 mg total) by mouth 3 (three) times daily as needed. 03/16/14   Leotis ShamesJeffery  Ewell PoeS Anderson, MD  meloxicam (MOBIC) 7.5 MG tablet Take 1 tablet (7.5 mg total) by mouth daily. 06/07/13   Shade FloodJeffrey R Greene, MD  omeprazole (PRILOSEC) 20 MG capsule Take 1 capsule (20 mg total) by mouth daily. 06/07/13   Shade FloodJeffrey  R Greene, MD   BP 147/77 mmHg  Pulse 55  Temp(Src) 98.1 F (36.7 C) (Oral)  Resp 16  Ht 6\' 1"  (1.854 m)  Wt 215 lb (97.523 kg)  BMI 28.37 kg/m2  SpO2 100% Physical Exam  Constitutional: He is oriented to person, place, and time. Vital signs are normal. He appears well-developed and well-nourished.  Non-toxic appearance. No distress.  Afebrile, nontoxic, NAD, well-appearing  HENT:  Head: Normocephalic and atraumatic.  Right Ear: Hearing, tympanic membrane, external ear and ear canal normal.  Left Ear: Hearing, tympanic membrane, external ear and ear canal normal.  Nose: Nose normal.  Mouth/Throat: Oropharynx is clear and moist and mucous membranes are normal.  Ears clear bilaterally  Eyes: Conjunctivae and EOM are normal. Pupils are equal, round, and reactive to light. Right eye exhibits no discharge. Left eye exhibits no discharge.  PERRL, EOMI  Neck: Normal range of motion. Neck supple. No spinous process tenderness and no muscular tenderness present. No rigidity. Normal range of motion present.  FROM intact without spinous process or paraspinous muscle TTP, no bony stepoffs or deformities, no muscle spasms. No rigidity or meningeal signs. No bruising or swelling.   Cardiovascular: Normal rate, regular rhythm, normal heart sounds and intact distal pulses.  Exam reveals no gallop and no friction rub.   No murmur heard. Pulmonary/Chest: Effort normal and breath sounds normal. No respiratory distress. He has no decreased breath sounds. He has no wheezes. He has no rhonchi. He has no rales.  Abdominal: Soft. Normal appearance and bowel sounds are normal. He exhibits no distension. There is no tenderness. There is no rigidity, no rebound, no guarding, no tenderness at McBurney's point and negative Murphy's sign.  Soft, NTND, +BS throughout, no r/g/r, neg murphy's, neg mcburney's  Musculoskeletal: Normal range of motion.  MAE x4 Strength 5/5 in all extremities Sensation grossly intact in  all extremities Gait steady and nonataxic  Neurological: He is alert and oriented to person, place, and time. He has normal strength and normal reflexes. No cranial nerve deficit or sensory deficit. He displays a negative Romberg sign. Coordination and gait normal. GCS eye subscore is 4. GCS verbal subscore is 5. GCS motor subscore is 6.  CN 2-12 grossly intact A&O x4 GCS 15 DTRs equal bilaterally Sensation and strength intact Gait nonataxic including with tandem walking Coordination with finger-to-nose WNL Neg romberg, neg pronator drift   Skin: Skin is warm, dry and intact. No rash noted.  Psychiatric: He has a normal mood and affect.  Nursing note and vitals reviewed.   ED Course  Procedures (including critical care time) Orthostatic VS: Vital Signs - Pulse Rate: 101 ; BP: 135/75 mmHg ; Patient Position (if appropriate): Lying  Vital Signs - Pulse Rate: 70 ; BP: 138/76 mmHg ; Patient Position (if appropriate): Sitting  Vital Signs - Pulse Rate: 63 ; BP: 130/82 mmHg ; Patient Position (if appropriate): Standing  Labs Review Labs Reviewed  BASIC METABOLIC PANEL - Abnormal; Notable for the following:    Glucose, Bld 124 (*)    GFR calc non Af Amer 80 (*)    All other components within normal limits  CBC WITH DIFFERENTIAL - Abnormal; Notable for the following:    Eosinophils Relative  6 (*)    All other components within normal limits  I-STAT CHEM 8, ED - Abnormal; Notable for the following:    Glucose, Bld 123 (*)    All other components within normal limits  PROTIME-INR    Imaging Review No results found.   EKG Interpretation None    EKG: NSR, HR 51  MDM   Final diagnoses:  Vertigo  Nausea  Nonintractable episodic headache, unspecified headache type    53 y.o. male with vertiginous dizziness. Hx of vertigo, similar to prior episodes. Full work up in March 2015 by Dr. Dareen Piano, including CT head, which was negative. Will obtain labs, EKG, orthostatics, and give  meclizine and zofran now for symptoms. Neuro exam benign, no red flag s/sx, doubt need for imaging at this time. States he doesn't want anything for his headache. Will reassess shortly.   7:44 AM Pt feels improved, would like to go home. Labs all unremarkable. EKG unremarkable. Orthostatics WNL. Discussed use of meclizine and given f/up with [facility] or using the resource guide. I explained the diagnosis and have given explicit precautions to return to the ER including for any other new or worsening symptoms. The patient understands and accepts the medical plan as it's been dictated and I have answered their questions. Discharge instructions concerning home care and prescriptions have been given. The patient is STABLE and is discharged to home in good condition.  BP 130/82 mmHg  Pulse 63  Temp(Src) 98.1 F (36.7 C) (Oral)  Resp 19  Ht 6\' 1"  (1.854 m)  Wt 215 lb (97.523 kg)  BMI 28.37 kg/m2  SpO2 98%  Meds ordered this encounter  Medications  . ondansetron (ZOFRAN) injection 4 mg    Sig:   . meclizine (ANTIVERT) tablet 25 mg    Sig:   . meclizine (ANTIVERT) 50 MG tablet    Sig: Take 1 tablet (50 mg total) by mouth 3 (three) times daily as needed for dizziness or nausea.    Dispense:  30 tablet    Refill:  0    Order Specific Question:  Supervising Provider    Answer:  Vida Roller [address] [place], PA-C 12/16/14 [id number]  Derwood Kaplan, MD 12/16/14 2311

## 2014-12-16 NOTE — ED Notes (Signed)
Pt alert, oriented, and ambulatory upon DC. Patient was advised to follow up with a PCP in 1 week.

## 2014-12-16 NOTE — ED Notes (Signed)
Pt presents with c/o dizziness onset after 3am, pt states he feels his head is swimming with nausea and slight HA.

## 2014-12-16 NOTE — ED Notes (Signed)
EKG given to EDP,Cook,MD., for review.
